# Patient Record
Sex: Male | Born: 2000 | Race: Black or African American | Hispanic: No | Marital: Single | Smoking: Never smoker
Health system: Southern US, Community
[De-identification: ages and names within clinical notes are randomized; demographics above are authoritative.]

---
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
## 2021-03-01 ENCOUNTER — Emergency Department (HOSPITAL_COMMUNITY)
Admission: EM | Admit: 2021-03-01 | Discharge: 2021-03-01 | Disposition: A | Discharge status: Home / Self Care | Payer: Medicaid Other | Attending: Emergency Medicine | Admitting: Emergency Medicine

## 2021-03-01 ENCOUNTER — Other Ambulatory Visit: Payer: Self-pay

## 2021-03-01 DIAGNOSIS — R42 Dizziness and giddiness: Secondary | ICD-10-CM | POA: Diagnosis not present

## 2021-03-01 DIAGNOSIS — T782XXA Anaphylactic shock, unspecified, initial encounter: Secondary | ICD-10-CM | POA: Insufficient documentation

## 2021-03-01 MED ORDER — DEXAMETHASONE SODIUM PHOSPHATE 10 MG/ML IJ SOLN
10.0000 mg | Freq: Once | INTRAMUSCULAR | Status: AC
  Administered 2021-03-01: 10 mg via INTRAVENOUS
  Filled 2021-03-01: qty 1

## 2021-03-01 MED ORDER — EPINEPHRINE 0.3 MG/0.3ML IJ SOAJ
INTRAMUSCULAR | Status: AC
  Administered 2021-03-01: 0.3 mg via INTRAMUSCULAR
  Filled 2021-03-01: qty 0.3

## 2021-03-01 MED ORDER — FAMOTIDINE 20 MG PO TABS: 20.0000 mg | ORAL_TABLET | Freq: Two times a day (BID) | ORAL | 0 refills | Status: AC

## 2021-03-01 MED ORDER — DIPHENHYDRAMINE HCL 25 MG PO TABS: 25.0000 mg | ORAL_TABLET | Freq: Four times a day (QID) | ORAL | 0 refills | Status: AC | PRN

## 2021-03-01 MED ORDER — FAMOTIDINE IN NACL 20-0.9 MG/50ML-% IV SOLN
20.0000 mg | Freq: Once | INTRAVENOUS | Status: AC
  Administered 2021-03-01: 20 mg via INTRAVENOUS
  Filled 2021-03-01: qty 50

## 2021-03-01 MED ORDER — DIPHENHYDRAMINE HCL 50 MG/ML IJ SOLN
25.0000 mg | Freq: Once | INTRAMUSCULAR | Status: AC
  Administered 2021-03-01: 25 mg via INTRAVENOUS
  Filled 2021-03-01: qty 1

## 2021-03-01 MED ORDER — EPINEPHRINE 0.3 MG/0.3ML IJ SOAJ: 0.3000 mg | INTRAMUSCULAR | 0 refills | Status: AC | PRN

## 2021-03-01 MED ORDER — ONDANSETRON HCL 4 MG/2ML IJ SOLN
4.0000 mg | Freq: Once | INTRAMUSCULAR | Status: AC
  Administered 2021-03-01: 4 mg via INTRAVENOUS
  Filled 2021-03-01: qty 2

## 2021-03-01 MED ORDER — PREDNISONE 20 MG PO TABS: 40.0000 mg | ORAL_TABLET | Freq: Every day | ORAL | 0 refills | Status: AC

## 2021-03-01 MED ORDER — EPINEPHRINE 0.3 MG/0.3ML IJ SOAJ
0.3000 mg | Freq: Once | INTRAMUSCULAR | Status: AC
Start: 2021-03-01 — End: 2021-03-01

## 2021-03-01 NOTE — ED Provider Notes (Signed)
[facility] EMERGENCY DEPARTMENT Provider Note   CSN: [id number] Arrival date & time: 03/01/21  1542     History Chief Complaint  Patient presents with   Allergic Reaction    Edward Owen is a 20 y.o. male.  Patient presents the emergency department for lightheadedness, dizziness, decreased responsiveness after being stung by 2 bees.  Patient was working in the yard when he was stung.  Initially after the sting he went back to what he was doing but developed itching.  He went inside and got into the shower and continued to feel worse.  A family member drove him to the emergency department.  He was given epinephrine in triage.  At that time his blood pressure was 75/30.  Patient had rapid improvement.  No other treatments prior to arrival.  He denies vomiting or diarrhea.  No significant hives but reports his skin was itching.  No full syncope.  He has never had a reaction like this in the past.      No past medical history on file.  There are no problems to display for this patient.   No past surgical history on file.     No family history on file.  Social History   Tobacco Use   Smoking status: Never    Home Medications Prior to Admission medications   Medication Sig Start Date End Date Taking? Authorizing Provider  ibuprofen (ADVIL,MOTRIN) 800 MG tablet Take 1 tablet (800 mg total) by mouth 3 (three) times daily. 07/23/16   Alvina Chou, PA    Allergies    Patient has no known allergies.  Review of Systems   Review of Systems  Constitutional:  Positive for fatigue. Negative for fever.  HENT:  Negative for facial swelling and trouble swallowing.   Eyes:  Negative for redness.  Respiratory:  Negative for shortness of breath, wheezing and stridor.   Cardiovascular:  Negative for chest pain.  Gastrointestinal:  Negative for nausea and vomiting.  Musculoskeletal:  Negative for myalgias.  Skin:  Negative for rash.  Neurological:   Positive for weakness and light-headedness.  Psychiatric/Behavioral:  Negative for confusion.    Physical Exam Updated Vital Signs BP (!) 154/97   Pulse 80   Temp 98.2 F (36.8 C) (Oral)   Resp 15   Ht 5\' 9"  (1.753 m)   Wt 77.1 kg   SpO2 100%   BMI 25.10 kg/m   Physical Exam Vitals and nursing note reviewed.  Constitutional:      General: He is not in acute distress.    Appearance: He is well-developed.  HENT:     Head: Normocephalic and atraumatic.     Mouth/Throat:     Mouth: Mucous membranes are moist.     Comments: No angioedema.  Eyes:     General:        Right eye: No discharge.        Left eye: No discharge.     Conjunctiva/sclera: Conjunctivae normal.  Cardiovascular:     Rate and Rhythm: Normal rate and regular rhythm.     Heart sounds: Normal heart sounds.  Pulmonary:     Effort: Pulmonary effort is normal.     Breath sounds: Normal breath sounds.  Abdominal:     Palpations: Abdomen is soft.     Tenderness: There is no abdominal tenderness.  Musculoskeletal:     Cervical back: Normal range of motion and neck supple.  Skin:    General: Skin is warm  and dry.  Neurological:     Mental Status: He is alert.    ED Results / Procedures / Treatments   Labs (all labs ordered are listed, but only abnormal results are displayed) Labs Reviewed - No data to display  EKG EKG Interpretation  Date/Time:  Wednesday March 01 2021 15:54:13 EDT Ventricular Rate:  79 PR Interval:  131 QRS Duration: 95 QT Interval:  391 QTC Calculation: 449 R Axis:   55 Text Interpretation: Sinus rhythm Confirmed by Virgina Norfolk ([id number]) on 03/01/2021 4:00:40 PM  Radiology No results found.  Procedures Procedures   Medications Ordered in ED Medications  EPINEPHrine (EPI-PEN) injection 0.3 mg (0.3 mg Intramuscular Given by Other 03/01/21 1548)  diphenhydrAMINE (BENADRYL) injection 25 mg (25 mg Intravenous Given 03/01/21 1557)  dexamethasone (DECADRON) injection 10 mg (10 mg  Intravenous Given 03/01/21 1559)  famotidine (PEPCID) IVPB 20 mg premix (0 mg Intravenous Stopped 03/01/21 1632)  ondansetron (ZOFRAN) injection 4 mg (4 mg Intravenous Given 03/01/21 1718)    ED Course  I have reviewed the triage vital signs and the nursing notes.  Pertinent labs & imaging results that were available during my care of the patient were reviewed by me and considered in my medical decision making (see chart for details).  Patient seen and examined.  Patient received epinephrine in triage with improvement.  He seems nearly back to baseline and is normotensive.  Will monitor for least 4 hours for signs of rebound.  Patient aware.  He is currently receiving Decadron, Benadryl, loratadine.  EKG reviewed.  Vital signs reviewed and are as follows: BP (!) 154/97   Pulse 80   Temp 98.2 F (36.8 C) (Oral)   Resp 15   Ht 5\' 9"  (1.753 m)   Wt 77.1 kg   SpO2 100%   BMI 25.10 kg/m   Patient monitored for several hours without any signs of rebound.  He is comfortable with discharge to home.  Discharge to home with prednisone, Benadryl, Pepcid, EpiPen.  Patient encouraged to call 911 with any recurrent symptoms and return with worsening.  CRITICAL CARE Performed by: PA-C Total critical care time: 35 minutes Critical care time was exclusive of separately billable procedures and treating other patients. Critical care was necessary to treat or prevent imminent or life-threatening deterioration. Critical care was time spent personally by me on the following activities: development of treatment plan with patient and/or surrogate as well as nursing, discussions with consultants, evaluation of patient's response to treatment, examination of patient, obtaining history from patient or surrogate, ordering and performing treatments and interventions, ordering and review of laboratory studies, ordering and review of radiographic studies, pulse oximetry and re-evaluation of patient's  condition.     MDM Rules/Calculators/A&P                          Patient with anaphylaxis after bee stings today.  Epinephrine administered on arrival with good response.  Patient without any signs of rebound during multiple hour observation period.  Discharged home as above.    Final Clinical Impression(s) / ED Diagnoses Final diagnoses:  Anaphylaxis, initial encounter    Rx / DC Orders ED Discharge Orders          Ordered    predniSONE (DELTASONE) 20 MG tablet  Daily        03/01/21 2126    diphenhydrAMINE (BENADRYL) 25 MG tablet  Every 6 hours PRN  03/01/21 2126    famotidine (PEPCID) 20 MG tablet  2 times daily        03/01/21 2126    EPINEPHrine 0.3 mg/0.3 mL IJ SOAJ injection  As needed        03/01/21 2126             Renne Crigler, PA-C 03/01/21 2324    Virgina Norfolk, DO 03/01/21 2336

## 2021-03-01 NOTE — ED Provider Notes (Signed)
Emergency Medicine Provider Triage Evaluation Note  Edward Owen , a 20 y.o. male  was evaluated in triage.  Pt complains of being stung by bees about 20 to 30 minutes prior to arrival.  He states he was mowing the lawn.  He has no history of allergic reaction to bees before. In triage patient is very weak, diaphoretic, lethargic and slow to respond.  He frequently slumped over but is still able to answer questions.  He has urticaria on bilateral arms.  Review of Systems  Positive: Rash, near syncope, diaphoresis Negative: Nausea, vomiting  Physical Exam  BP (!) 149/87   Pulse 73   Temp 98.2 F (36.8 C) (Oral)   Resp 19   Ht 5\' 9"  (1.753 m)   Wt 77.1 kg   SpO2 97%   BMI 25.10 kg/m  Gen:   Obvious distress, diaphoretic, global decreased tone Resp:  Normal effort  MSK:   Moves extremities without difficulty  Other:  Diffuse urticarial rash.  No obvious lip or facial swelling.  He is cool and diaphoretic.  Medical Decision Making  Medically screening exam initiated at 4:00 PM.  Appropriate orders placed.  Hollie Bartus was informed that the remainder of the evaluation will be completed by another provider, this initial triage assessment does not replace that evaluation, and the importance of remaining in the ED until their evaluation is complete.  Patient is hypotensive in triage after being stung by multiple bees.  His blood pressure was 75/30 which appears accurate and consistent with his clinical presentation. Verbal order given for EpiPen which I administered in his right thigh.  After this he started improving, was more alert and interactive, with increased tone and his diaphoresis began improving in a matter of minutes.  I placed IV in R ac given his hypotension and placed orders for IV steroids, benadryl, and pepcid.    Patient was taken directly back to a room.    Angiocath insertion Performed by: Valora Corporal  Consent: Verbal consent obtained. Risks and benefits:  risks, benefits and alternatives were discussed Time out: Immediately prior to procedure a "time out" was called to verify the correct patient, procedure, equipment, support staff and site/side marked as required.  Preparation: Patient was prepped and draped in the usual sterile fashion.  Vein Location: R AC  Not Ultrasound Guided  Gauge: 18  Normal blood return and flush without difficulty Patient tolerance: Patient tolerated the procedure well with no immediate complications.     Lyndel Safe, PA-C 03/01/21 1605    03/03/21, MD 03/04/21 1525

## 2021-03-01 NOTE — Discharge Instructions (Signed)
Please read and follow all provided instructions.  Your diagnoses today include:  1. Anaphylaxis, initial encounter     Tests performed today include: Vital signs. See below for your results today.   Medications prescribed:  Prednisone - steroid medicine   It is best to take this medication in the morning to prevent sleeping problems. If you are diabetic, monitor your blood sugar closely and stop taking Prednisone if blood sugar is over 300. Take with food to prevent stomach upset.   Benadryl (diphenhydramine) - antihistamine  You can find this medication over-the-counter.   DO NOT exceed:  50mg  Benadryl every 6 hours    Benadryl will make you drowsy. DO NOT drive or perform any activities that require you to be awake and alert if taking this.  Pepcid (famotidine) - antihistamine  You can find this medication over-the-counter.   DO NOT exceed:  20mg  Pepcid every 12 hours  Epi-pen Inject into thigh as directed if you have a severe reaction that causes throat swelling or any trouble breathing. Call 9-1-1 immediately if you use an Epi-pen. You should be evaluated at a hospital as soon as possible.    Take any prescribed medications only as directed.  Home care instructions:  Follow any educational materials contained in this packet  Follow-up instructions: Please follow-up with your primary care provider in the next 7 days for further evaluation of your symptoms.   Return instructions:  Please return to the Emergency Department if you experience worsening symptoms.  Call 9-1-1 immediately if you have an allergic reaction that involves your lips, mouth, throat or if you have any difficulty breathing. This is a life-threatening emergency.  Please return if you have any other emergent concerns.  Additional Information:  Your vital signs today were: BP 102/73   Pulse 85   Temp 98.2 F (36.8 C) (Oral)   Resp 19   Ht 5\' 9"  (1.753 m)   Wt 77.1 kg   SpO2 100%   BMI 25.10  kg/m  If your blood pressure (BP) was elevated above 135/85 this visit, please have this repeated by your doctor within one month. --------------

## 2021-03-01 NOTE — ED Triage Notes (Signed)
Pt arrives POV, stung by several bees approx 30 mins ago, pt lethargic, diaphoretic and weak.  Epi pen administered in triage

## 2021-03-01 NOTE — ED Notes (Signed)
Patient verbalizes understanding of discharge instructions. Opportunity for questioning and answers were provided. Armband removed by staff, pt discharged from ED ambulatory.   

## 2021-03-01 NOTE — ED Notes (Signed)
Pt vommited on floor was mainly clear with some sediment
